# Patient Record
Sex: Male | Born: 1970 | Race: White | Hispanic: No | Marital: Single | Smoking: Current every day smoker
Health system: Southern US, Community
[De-identification: ages and names within clinical notes are randomized; demographics above are authoritative.]

## PROBLEM LIST (undated history)

## (undated) DIAGNOSIS — N200 Calculus of kidney: Secondary | ICD-10-CM

## (undated) HISTORY — PX: TONSILLECTOMY: SUR1361

## (undated) HISTORY — PX: CYSTOSCOPY: SUR368

## (undated) HISTORY — PX: LITHOTRIPSY: SUR834

---

## 2012-06-27 ENCOUNTER — Emergency Department (HOSPITAL_BASED_OUTPATIENT_CLINIC_OR_DEPARTMENT_OTHER): Payer: 59

## 2012-06-27 ENCOUNTER — Emergency Department (HOSPITAL_BASED_OUTPATIENT_CLINIC_OR_DEPARTMENT_OTHER)
Admission: EM | Admit: 2012-06-27 | Discharge: 2012-06-27 | Disposition: A | Discharge status: Home Health | Payer: 59 | Attending: Emergency Medicine | Admitting: Emergency Medicine

## 2012-06-27 ENCOUNTER — Encounter (HOSPITAL_BASED_OUTPATIENT_CLINIC_OR_DEPARTMENT_OTHER): Payer: Self-pay | Admitting: *Deleted

## 2012-06-27 DIAGNOSIS — R509 Fever, unspecified: Secondary | ICD-10-CM | POA: Insufficient documentation

## 2012-06-27 DIAGNOSIS — J189 Pneumonia, unspecified organism: Secondary | ICD-10-CM | POA: Insufficient documentation

## 2012-06-27 MED ORDER — SODIUM CHLORIDE 0.9 % IV SOLN: 1000.0000 mL | INTRAVENOUS | Status: DC

## 2012-06-27 MED ORDER — SODIUM CHLORIDE 0.9 % IV SOLN
1000.0000 mL | Freq: Once | INTRAVENOUS | Status: AC
  Administered 2012-06-27: 1000 mL via INTRAVENOUS

## 2012-06-27 MED ORDER — ALBUTEROL SULFATE HFA 108 (90 BASE) MCG/ACT IN AERS
2.0000 | INHALATION_SPRAY | Freq: Once | RESPIRATORY_TRACT | Status: AC
  Administered 2012-06-27: 2 via RESPIRATORY_TRACT
  Filled 2012-06-27: qty 6.7

## 2012-06-27 MED ORDER — IBUPROFEN 400 MG PO TABS
600.0000 mg | ORAL_TABLET | Freq: Once | ORAL | Status: AC
  Administered 2012-06-27: 600 mg via ORAL
  Filled 2012-06-27: qty 1

## 2012-06-27 MED ORDER — MOXIFLOXACIN HCL 400 MG PO TABS: 400.0000 mg | ORAL_TABLET | Freq: Every day | ORAL | Status: AC

## 2012-06-27 MED ORDER — MOXIFLOXACIN HCL 400 MG PO TABS
400.0000 mg | ORAL_TABLET | Freq: Once | ORAL | Status: AC
  Administered 2012-06-27: 400 mg via ORAL
  Filled 2012-06-27: qty 1

## 2012-06-27 NOTE — ED Notes (Signed)
Pt amb to room 9 with quick steady gait in nad. Pt wearing mask, reports cough, congestion and fevers x 2 days.

## 2012-06-27 NOTE — ED Provider Notes (Signed)
History     CSN: [id number]  Arrival date & time 06/27/12  4098   First MD Initiated Contact with Patient 06/27/12 [phone]      Chief Complaint  Patient presents with  . Nasal Congestion  . Fever  . Cough    (Consider location/radiation/quality/duration/timing/severity/associated sxs/prior treatment) The history is provided by the patient.   the patient reports coughing congestion over the past 2 days with associated fever.  He stopped smoking cigarettes 8 months ago.  He has no prior history of asthma or COPD.  He has a shortness of breath.  He reports fevers chills and myalgias.  He denies nausea and vomiting.  He has no chest pain.  He denies diarrhea.  He has no other complaints.  He is otherwise without significant medical problems.  His symptoms are mild in severity.  Nothing worsens or improves his symptoms.  History reviewed. No pertinent past medical history.  History reviewed. No pertinent past surgical history.  History reviewed. No pertinent family history.  History  Substance Use Topics  . Smoking status: Former Games developer  . Smokeless tobacco: Not on file  . Alcohol Use: No      Review of Systems  Constitutional: Positive for fever.  Respiratory: Positive for cough.   All other systems reviewed and are negative.    Allergies  Review of patient's allergies indicates no known allergies.  Home Medications  No current outpatient prescriptions on file.  BP 151/85  Pulse 112  Temp 101.2 F (38.4 C) (Oral)  Resp 18  Ht 5\' 9"  (1.753 m)  Wt 201 lb (91.173 kg)  BMI 29.68 kg/m2  SpO2 96%  Physical Exam  Nursing note and vitals reviewed. Constitutional: He is oriented to person, place, and time. He appears well-developed and well-nourished.  HENT:  Head: Normocephalic and atraumatic.  Eyes: EOM are normal.  Neck: Normal range of motion.  Cardiovascular: Normal rate, regular rhythm, normal heart sounds and intact distal pulses.   Pulmonary/Chest: Effort  normal and breath sounds normal. No respiratory distress.       Rhonchi in right lower base  Abdominal: Soft. He exhibits no distension. There is no tenderness.  Musculoskeletal: Normal range of motion.  Neurological: He is alert and oriented to person, place, and time.  Skin: Skin is warm and dry.  Psychiatric: He has a normal mood and affect. Judgment normal.    ED Course  Procedures (including critical care time)  Labs Reviewed - No data to display Dg Chest 2 View  06/27/2012  *RADIOLOGY REPORT*  Clinical Data: Cough, fever, congestion  CHEST - 2 VIEW  Comparison: None.  Findings: There is parenchymal opacity in the right lower lobe suspicious for pneumonia.  The remainder of the lungs are clear. No effusion is seen.  Mediastinal contours appear normal.  The heart is within normal limits in size.  No bony abnormality is seen.  IMPRESSION: Right lower lobe parenchymal opacity suspicious for pneumonia.   Original Report Authenticated By: Juline Patch, M.D.     I personally reviewed the imaging tests through PACS system     1. CAP (community acquired pneumonia)   2. Fever       MDM  Will treat his right lower lobe pneumonia with Avelox.  The patient was hydrated in the emergency department.  He feels much better at this time.  Albuterol inhaler to help with cough.  The patient is instructed to followup with his PCP for any new or worsening symptoms or  to return the emergency department.        Lyanne Co, MD 06/27/12 [phone]

## 2012-11-02 ENCOUNTER — Encounter (HOSPITAL_BASED_OUTPATIENT_CLINIC_OR_DEPARTMENT_OTHER): Payer: Self-pay

## 2012-11-02 ENCOUNTER — Emergency Department (HOSPITAL_BASED_OUTPATIENT_CLINIC_OR_DEPARTMENT_OTHER)
Admission: EM | Admit: 2012-11-02 | Discharge: 2012-11-02 | Disposition: A | Discharge status: Home / Self Care | Payer: Self-pay | Attending: Emergency Medicine | Admitting: Emergency Medicine

## 2012-11-02 ENCOUNTER — Emergency Department (HOSPITAL_BASED_OUTPATIENT_CLINIC_OR_DEPARTMENT_OTHER): Payer: Self-pay

## 2012-11-02 DIAGNOSIS — Z79899 Other long term (current) drug therapy: Secondary | ICD-10-CM | POA: Insufficient documentation

## 2012-11-02 DIAGNOSIS — R059 Cough, unspecified: Secondary | ICD-10-CM | POA: Insufficient documentation

## 2012-11-02 DIAGNOSIS — IMO0001 Reserved for inherently not codable concepts without codable children: Secondary | ICD-10-CM | POA: Insufficient documentation

## 2012-11-02 DIAGNOSIS — R5381 Other malaise: Secondary | ICD-10-CM | POA: Insufficient documentation

## 2012-11-02 DIAGNOSIS — J189 Pneumonia, unspecified organism: Secondary | ICD-10-CM | POA: Insufficient documentation

## 2012-11-02 DIAGNOSIS — R05 Cough: Secondary | ICD-10-CM | POA: Insufficient documentation

## 2012-11-02 DIAGNOSIS — F17200 Nicotine dependence, unspecified, uncomplicated: Secondary | ICD-10-CM | POA: Insufficient documentation

## 2012-11-02 DIAGNOSIS — J3489 Other specified disorders of nose and nasal sinuses: Secondary | ICD-10-CM | POA: Insufficient documentation

## 2012-11-02 DIAGNOSIS — M549 Dorsalgia, unspecified: Secondary | ICD-10-CM | POA: Insufficient documentation

## 2012-11-02 DIAGNOSIS — Z87442 Personal history of urinary calculi: Secondary | ICD-10-CM | POA: Insufficient documentation

## 2012-11-02 HISTORY — DX: Calculus of kidney: N20.0

## 2012-11-02 MED ORDER — METHOCARBAMOL 500 MG PO TABS
1000.0000 mg | ORAL_TABLET | Freq: Once | ORAL | Status: AC
Start: 2012-11-02 — End: 2012-11-02
  Administered 2012-11-02: 1000 mg via ORAL
  Filled 2012-11-02: qty 2

## 2012-11-02 MED ORDER — KETOROLAC TROMETHAMINE 60 MG/2ML IM SOLN
60.0000 mg | Freq: Once | INTRAMUSCULAR | Status: AC
  Administered 2012-11-02: 60 mg via INTRAMUSCULAR
  Filled 2012-11-02: qty 2

## 2012-11-02 MED ORDER — BENZONATATE 100 MG PO CAPS
100.0000 mg | ORAL_CAPSULE | Freq: Once | ORAL | Status: AC
  Administered 2012-11-02: 100 mg via ORAL
  Filled 2012-11-02: qty 1

## 2012-11-02 MED ORDER — IBUPROFEN 600 MG PO TABS: 600.0000 mg | ORAL_TABLET | Freq: Four times a day (QID) | ORAL | Status: AC | PRN

## 2012-11-02 MED ORDER — METHOCARBAMOL 500 MG PO TABS: 500.0000 mg | ORAL_TABLET | Freq: Two times a day (BID) | ORAL | Status: AC

## 2012-11-02 MED ORDER — BENZONATATE 100 MG PO CAPS: 100.0000 mg | ORAL_CAPSULE | Freq: Three times a day (TID) | ORAL | Status: AC

## 2012-11-02 MED ORDER — AZITHROMYCIN 250 MG PO TABS: 250.0000 mg | ORAL_TABLET | Freq: Every day | ORAL | Status: AC

## 2012-11-02 MED ORDER — DEXTROSE 5 % IV SOLN
1.0000 g | Freq: Once | INTRAVENOUS | Status: AC
  Administered 2012-11-02: 1 g via INTRAVENOUS
  Filled 2012-11-02: qty 10

## 2012-11-02 NOTE — ED Provider Notes (Signed)
History     CSN: [id number]  Arrival date & time 11/02/12  4098   First MD Initiated Contact with Patient 11/02/12 [phone]      Chief Complaint  Patient presents with  . Fever  . Generalized Body Aches  . Cough    (Consider location/radiation/quality/duration/timing/severity/associated sxs/prior treatment) HPI Pt with 2 days of fever, chills, myalgia, non-productive cough. No sick contacts. No headache or neck pain. Pt states he pulled some muscles in his back last week while moving a refrigerator and it is exacerbated with coughing. No focal weakness or numbness.  Past Medical History  Diagnosis Date  . Kidney stones     Past Surgical History  Procedure Date  . Lithotripsy   . Cystoscopy   . Tonsillectomy     No family history on file.  History  Substance Use Topics  . Smoking status: Current Every Day Smoker -- 0.5 packs/day    Types: Cigarettes  . Smokeless tobacco: Not on file  . Alcohol Use: No      Review of Systems  Constitutional: Positive for fever, chills and fatigue.  HENT: Positive for congestion. Negative for sore throat, neck pain and neck stiffness.   Respiratory: Positive for cough. Negative for shortness of breath.   Cardiovascular: Negative for chest pain.  Gastrointestinal: Negative for nausea, vomiting and abdominal pain.  Musculoskeletal: Positive for myalgias and back pain.  Skin: Negative for pallor and rash.  Neurological: Negative for dizziness, weakness, light-headedness, numbness and headaches.  All other systems reviewed and are negative.    Allergies  Review of patient's allergies indicates no known allergies.  Home Medications   Current Outpatient Rx  Name  Route  Sig  Dispense  Refill  . AZITHROMYCIN 250 MG PO TABS   Oral   Take 1 tablet (250 mg total) by mouth daily. Take first 2 tablets together, then 1 every day until finished.   6 tablet   0   . BENZONATATE 100 MG PO CAPS   Oral   Take 1 capsule (100 mg total) by  mouth every 8 (eight) hours.   21 capsule   0   . IBUPROFEN 600 MG PO TABS   Oral   Take 1 tablet (600 mg total) by mouth every 6 (six) hours as needed for pain.   30 tablet   0   . METHOCARBAMOL 500 MG PO TABS   Oral   Take 1 tablet (500 mg total) by mouth 2 (two) times daily.   20 tablet   0     BP 149/89  Pulse 106  Temp 100 F (37.8 C)  Resp 16  Ht 5\' 9"  (1.753 m)  Wt 205 lb (92.987 kg)  BMI 30.27 kg/m2  SpO2 97%  Physical Exam  Nursing note and vitals reviewed. Constitutional: He is oriented to person, place, and time. He appears well-developed and well-nourished. No distress.  HENT:  Head: Normocephalic and atraumatic.  Mouth/Throat: Oropharynx is clear and moist.  Eyes: EOM are normal. Pupils are equal, round, and reactive to light.  Neck: Normal range of motion. Neck supple.  Cardiovascular: Normal rate and regular rhythm.   Pulmonary/Chest: Effort normal and breath sounds normal. No respiratory distress. He has no wheezes. He has no rales. He exhibits no tenderness.  Abdominal: Soft. Bowel sounds are normal. He exhibits no mass. There is no tenderness. There is no rebound and no guarding.  Musculoskeletal: Normal range of motion. He exhibits tenderness (TTP over R paraspinal lumbar muscles.  No midline TTP). He exhibits no edema.  Neurological: He is alert and oriented to person, place, and time.  Skin: Skin is warm and dry. No rash noted. No erythema.  Psychiatric: He has a normal mood and affect. His behavior is normal.    ED Course  Procedures (including critical care time)  Labs Reviewed - No data to display Dg Chest 2 View  11/02/2012  *RADIOLOGY REPORT*  Clinical Data: Fever and generalized bodyaches.  Cough  CHEST - 2 VIEW  Comparison: 06/27/2012  Findings: Normal heart size.  No pleural effusion or edema.  Plate- like atelectasis is noted within the left lower lobe.  Within the right upper lobe there are patchy airspace densities, suspicious for  pneumonia.  Also in the right upper lobe is a linear branching opacity which is favored to represent mucous plugging.  IMPRESSION:  1.  Right upper lobe opacity is concerning for pneumonia. 2.  Suspect mucus plugging within the right upper lobe airway.   Original Report Authenticated By: Signa Kell, M.D.      1. Community acquired pneumonia       MDM  Pt is well appearing with flu-like symptoms. Will r/o PNA with chest xray  Rocephin given IV and will d/c with z pak. Advised to return for worsening symptoms or concerns       Loren Racer, MD 11/02/12 1122

## 2012-11-02 NOTE — ED Notes (Signed)
Pt reports onset of generalized body aches, fever and cough that started yesterday.

## 2022-01-30 ENCOUNTER — Emergency Department (HOSPITAL_BASED_OUTPATIENT_CLINIC_OR_DEPARTMENT_OTHER)
Admission: EM | Admit: 2022-01-30 | Discharge: 2022-01-30 | Disposition: A | Discharge status: Home / Self Care | Payer: Self-pay | Attending: Emergency Medicine | Admitting: Emergency Medicine

## 2022-01-30 ENCOUNTER — Encounter (HOSPITAL_BASED_OUTPATIENT_CLINIC_OR_DEPARTMENT_OTHER): Payer: Self-pay | Admitting: Emergency Medicine

## 2022-01-30 ENCOUNTER — Emergency Department (HOSPITAL_BASED_OUTPATIENT_CLINIC_OR_DEPARTMENT_OTHER): Payer: Self-pay

## 2022-01-30 ENCOUNTER — Other Ambulatory Visit: Payer: Self-pay

## 2022-01-30 DIAGNOSIS — N410 Acute prostatitis: Secondary | ICD-10-CM | POA: Insufficient documentation

## 2022-01-30 DIAGNOSIS — Z20822 Contact with and (suspected) exposure to covid-19: Secondary | ICD-10-CM | POA: Insufficient documentation

## 2022-01-30 DIAGNOSIS — R000 Tachycardia, unspecified: Secondary | ICD-10-CM | POA: Insufficient documentation

## 2022-01-30 DIAGNOSIS — R197 Diarrhea, unspecified: Secondary | ICD-10-CM | POA: Insufficient documentation

## 2022-01-30 DIAGNOSIS — R112 Nausea with vomiting, unspecified: Secondary | ICD-10-CM | POA: Insufficient documentation

## 2022-01-30 DIAGNOSIS — R0682 Tachypnea, not elsewhere classified: Secondary | ICD-10-CM | POA: Insufficient documentation

## 2022-01-30 LAB — RESP PANEL BY RT-PCR (FLU A&B, COVID) ARPGX2
Influenza A by PCR: NEGATIVE
Influenza B by PCR: NEGATIVE
SARS Coronavirus 2 by RT PCR: NEGATIVE

## 2022-01-30 LAB — LACTIC ACID, PLASMA: Lactic Acid, Venous: 1.5 mmol/L (ref 0.5–1.9)

## 2022-01-30 LAB — URINALYSIS, ROUTINE W REFLEX MICROSCOPIC
Glucose, UA: 100 mg/dL — AB
Ketones, ur: 15 mg/dL — AB
Leukocytes,Ua: NEGATIVE
Nitrite: NEGATIVE
Protein, ur: >=300 mg/dL — AB
Specific Gravity, Urine: >=1.03 (ref 1.005–1.030)
pH: 5 (ref 5.0–8.0)

## 2022-01-30 LAB — DIFFERENTIAL
Abs Immature Granulocytes: 0.06 10*3/uL (ref 0.00–0.07)
Basophils Absolute: 0 10*3/uL (ref 0.0–0.1)
Basophils Relative: 0 %
Eosinophils Absolute: 0 10*3/uL (ref 0.0–0.5)
Eosinophils Relative: 0 %
Immature Granulocytes: 1 %
Lymphocytes Relative: 6 %
Lymphs Abs: 0.6 10*3/uL — ABNORMAL LOW (ref 0.7–4.0)
Monocytes Absolute: 0.3 10*3/uL (ref 0.1–1.0)
Monocytes Relative: 3 %
Neutro Abs: 9.5 10*3/uL — ABNORMAL HIGH (ref 1.7–7.7)
Neutrophils Relative %: 90 %
Smear Review: NORMAL

## 2022-01-30 LAB — COMPREHENSIVE METABOLIC PANEL
ALT: 21 U/L (ref 0–44)
AST: 18 U/L (ref 15–41)
Albumin: 3.5 g/dL (ref 3.5–5.0)
Alkaline Phosphatase: 103 U/L (ref 38–126)
Anion gap: 11 (ref 5–15)
BUN: 17 mg/dL (ref 6–20)
CO2: 18 mmol/L — ABNORMAL LOW (ref 22–32)
Calcium: 8.6 mg/dL — ABNORMAL LOW (ref 8.9–10.3)
Chloride: 102 mmol/L (ref 98–111)
Creatinine, Ser: 1.31 mg/dL — ABNORMAL HIGH (ref 0.61–1.24)
GFR, Estimated: >60 mL/min (ref >60)
Glucose, Bld: 129 mg/dL — ABNORMAL HIGH (ref 70–99)
Potassium: 3.3 mmol/L — ABNORMAL LOW (ref 3.5–5.1)
Sodium: 131 mmol/L — ABNORMAL LOW (ref 135–145)
Total Bilirubin: 1.2 mg/dL (ref 0.3–1.2)
Total Protein: 7.6 g/dL (ref 6.5–8.1)

## 2022-01-30 LAB — URINALYSIS, MICROSCOPIC (REFLEX)

## 2022-01-30 LAB — PROTIME-INR
INR: 1.2 (ref 0.8–1.2)
Prothrombin Time: 14.7 seconds (ref 11.4–15.2)

## 2022-01-30 LAB — CBC
HCT: 45 % (ref 39.0–52.0)
Hemoglobin: 16 g/dL (ref 13.0–17.0)
MCH: 30.8 pg (ref 26.0–34.0)
MCHC: 35.6 g/dL (ref 30.0–36.0)
MCV: 86.7 fL (ref 80.0–100.0)
Platelets: 150 10*3/uL (ref 150–400)
RBC: 5.19 MIL/uL (ref 4.22–5.81)
RDW: 12 % (ref 11.5–15.5)
WBC: 10.5 10*3/uL (ref 4.0–10.5)
nRBC: 0 % (ref 0.0–0.2)

## 2022-01-30 LAB — LIPASE, BLOOD: Lipase: 30 U/L (ref 11–51)

## 2022-01-30 LAB — APTT: aPTT: 33 seconds (ref 24–36)

## 2022-01-30 MED ORDER — IOHEXOL 300 MG/ML  SOLN
100.0000 mL | Freq: Once | INTRAMUSCULAR | Status: AC | PRN
  Administered 2022-01-30: 100 mL via INTRAVENOUS

## 2022-01-30 MED ORDER — ONDANSETRON 4 MG PO TBDP: ORAL_TABLET | ORAL | 0 refills | Status: AC

## 2022-01-30 MED ORDER — SULFAMETHOXAZOLE-TRIMETHOPRIM 800-160 MG PO TABS: 1.0000 | ORAL_TABLET | Freq: Two times a day (BID) | ORAL | 0 refills | Status: AC

## 2022-01-30 MED ORDER — ACETAMINOPHEN 500 MG PO TABS
1000.0000 mg | ORAL_TABLET | Freq: Once | ORAL | Status: AC
Start: 2022-01-30 — End: 2022-01-30
  Administered 2022-01-30: 1000 mg via ORAL
  Filled 2022-01-30: qty 2

## 2022-01-30 MED ORDER — ONDANSETRON HCL 4 MG/2ML IJ SOLN
4.0000 mg | Freq: Once | INTRAMUSCULAR | Status: AC
  Administered 2022-01-30: 4 mg via INTRAVENOUS
  Filled 2022-01-30: qty 2

## 2022-01-30 MED ORDER — SODIUM CHLORIDE 0.9 % IV SOLN
2.0000 g | Freq: Once | INTRAVENOUS | Status: AC
Start: 2022-01-30 — End: 2022-01-30
  Administered 2022-01-30: 2 g via INTRAVENOUS
  Filled 2022-01-30: qty 20

## 2022-01-30 MED ORDER — LACTATED RINGERS IV BOLUS
1000.0000 mL | Freq: Once | INTRAVENOUS | Status: AC
  Administered 2022-01-30: 1000 mL via INTRAVENOUS

## 2022-01-30 MED ORDER — LACTATED RINGERS IV BOLUS (SEPSIS)
1000.0000 mL | Freq: Once | INTRAVENOUS | Status: AC
  Administered 2022-01-30: 1000 mL via INTRAVENOUS

## 2022-01-30 NOTE — Discharge Instructions (Signed)
Please call the urology office and try to see them within the next week.  Please return for worsening abdominal pain or inability to eat or drink.  You can take Imodium for diarrhea. ? ?Take tylenol 2 pills 4 times a day and motrin 4 pills 3 times a day.  Drink plenty of fluids.   ?

## 2022-01-30 NOTE — ED Triage Notes (Signed)
Pt reports urinary frequency, fever, and n/v/d since Wednesday. Had a fever of 101.60F this am at home. Has not taken antipyretics yet.  ?

## 2022-01-30 NOTE — ED Provider Notes (Signed)
?[facility] EMERGENCY DEPARTMENT ?Provider Note ? ? ?CSN: [id number] ?Arrival date & time: 01/30/22  0815 ? ?  ? ?History ? ?Chief Complaint  ?Patient presents with  ? Emesis  ? ? ?Michael Mcpherson is a 51 y.o. male. ? ?51 yo M with a chief complaints of increased urinary frequency and fever.  This been going on for a few days now.  He is also been having nausea vomiting and diarrhea.  Not really able to keep anything down.  Denies any significant abdominal pain.  Denies cough or congestion.  Denies sick contacts. ? ? ?Emesis ? ?  ? ?Home Medications ?Prior to Admission medications   ?Medication Sig Start Date End Date Taking? Authorizing Provider  ?ondansetron (ZOFRAN-ODT) 4 MG disintegrating tablet 4mg  ODT q4 hours prn nausea/vomit 01/30/22  Yes 02/01/22, DO  ?sulfamethoxazole-trimethoprim (BACTRIM DS) 800-160 MG tablet Take 1 tablet by mouth 2 (two) times daily for 14 days. 01/30/22 02/13/22 Yes 02/15/22, DO  ?azithromycin (ZITHROMAX) 250 MG tablet Take 1 tablet (250 mg total) by mouth daily. Take first 2 tablets together, then 1 every day until finished. 11/02/12   11/04/12, MD  ?benzonatate (TESSALON) 100 MG capsule Take 1 capsule (100 mg total) by mouth every 8 (eight) hours. 11/02/12   11/04/12, MD  ?ibuprofen (ADVIL,MOTRIN) 600 MG tablet Take 1 tablet (600 mg total) by mouth every 6 (six) hours as needed for pain. 11/02/12   11/04/12, MD  ?methocarbamol (ROBAXIN) 500 MG tablet Take 1 tablet (500 mg total) by mouth 2 (two) times daily. 11/02/12   11/04/12, MD  ?   ? ?Allergies    ?Patient has no known allergies.   ? ?Review of Systems   ?Review of Systems  ?Gastrointestinal:  Positive for vomiting.  ? ?Physical Exam ?Updated Vital Signs ?BP 127/74   Pulse (!) 113   Temp (!) 102.7 ?F (39.3 ?C) (Oral)   Resp (!) 21   SpO2 98%  ?Physical Exam ?Vitals and nursing note reviewed.  ?Constitutional:   ?   Appearance: He is well-developed.  ?HENT:  ?   Head: Normocephalic and  atraumatic.  ?Eyes:  ?   Pupils: Pupils are equal, round, and reactive to light.  ?Neck:  ?   Vascular: No JVD.  ?Cardiovascular:  ?   Rate and Rhythm: Regular rhythm. Tachycardia present.  ?   Heart sounds: No murmur heard. ?  No friction rub. No gallop.  ?Pulmonary:  ?   Effort: No respiratory distress.  ?   Breath sounds: No wheezing.  ?   Comments: Tachypnea ?Abdominal:  ?   General: There is no distension.  ?   Tenderness: There is no abdominal tenderness. There is no guarding or rebound.  ?Musculoskeletal:     ?   General: Normal range of motion.  ?   Cervical back: Normal range of motion and neck supple.  ?Skin: ?   Coloration: Skin is not pale.  ?   Findings: No rash.  ?Neurological:  ?   Mental Status: He is alert and oriented to person, place, and time.  ?Psychiatric:     ?   Behavior: Behavior normal.  ? ? ?ED Results / Procedures / Treatments   ?Labs ?(all labs ordered are listed, but only abnormal results are displayed) ?Labs Reviewed  ?COMPREHENSIVE METABOLIC PANEL - Abnormal; Notable for the following components:  ?    Result Value  ? Sodium 131 (*)   ? Potassium 3.3 (*)   ?  CO2 18 (*)   ? Glucose, Bld 129 (*)   ? Creatinine, Ser 1.31 (*)   ? Calcium 8.6 (*)   ? All other components within normal limits  ?URINALYSIS, ROUTINE W REFLEX MICROSCOPIC - Abnormal; Notable for the following components:  ? Color, Urine ORANGE (*)   ? Glucose, UA 100 (*)   ? Hgb urine dipstick MODERATE (*)   ? Bilirubin Urine MODERATE (*)   ? Ketones, ur 15 (*)   ? Protein, ur >=300 (*)   ? All other components within normal limits  ?URINALYSIS, MICROSCOPIC (REFLEX) - Abnormal; Notable for the following components:  ? Bacteria, UA MANY (*)   ? All other components within normal limits  ?DIFFERENTIAL - Abnormal; Notable for the following components:  ? Neutro Abs 9.5 (*)   ? Lymphs Abs 0.6 (*)   ? All other components within normal limits  ?RESP PANEL BY RT-PCR (FLU A&B, COVID) [id number]  ?URINE CULTURE  ?CULTURE, BLOOD (ROUTINE X  2)  ?CULTURE, BLOOD (ROUTINE X 2)  ?LIPASE, BLOOD  ?CBC  ?LACTIC ACID, PLASMA  ?PROTIME-INR  ?APTT  ? ? ?EKG ?EKG Interpretation ? ?Date/Time:  Sunday January 30 2022 08:32:43 EDT ?Ventricular Rate:  132 ?PR Interval:  113 ?QRS Duration: 125 ?QT Interval:  344 ?QTC Calculation: 510 ?R Axis:   98 ?Text Interpretation: Sinus tachycardia Atrial premature complex Nonspecific intraventricular conduction delay Baseline wander in lead(s) II V5 V6 No old tracing to compare Confirmed by Melene PlanFloyd, Timofey Carandang [phone]([id number]) on 01/30/2022 9:54:59 AM ? ?Radiology ?CT ABDOMEN PELVIS W CONTRAST ? ?Result Date: 01/30/2022 ?CLINICAL DATA:  Abdominal pain and fever for 5 days. Nausea, vomiting, and diarrhea. Urinary frequency. EXAM: CT ABDOMEN AND PELVIS WITH CONTRAST TECHNIQUE: Multidetector CT imaging of the abdomen and pelvis was performed using the standard protocol following bolus administration of intravenous contrast. RADIATION DOSE REDUCTION: This exam was performed according to the departmental dose-optimization program which includes automated exposure control, adjustment of the mA and/or kV according to patient size and/or use of iterative reconstruction technique. CONTRAST:  100mL OMNIPAQUE IOHEXOL 300 MG/ML  SOLN COMPARISON:  None. FINDINGS: Lower Chest: No acute findings. Hepatobiliary: No hepatic masses identified. Gallbladder is unremarkable. No evidence of biliary ductal dilatation. Pancreas:  No mass or inflammatory changes. Spleen: Within normal limits in size and appearance. Adrenals/Urinary Tract: No masses identified. A 4 mm calculus is seen in the upper pole the left kidney. No evidence of ureteral calculi or hydronephrosis. Mild diffuse bladder wall thickening is seen, with hazy opacity in the adjacent perivesical fat, consistent with cystitis. Stomach/Bowel: No evidence of obstruction, inflammatory process or abnormal fluid collections. Vascular/Lymphatic: No pathologically enlarged lymph nodes. No acute vascular findings. Aortic  atherosclerotic calcification noted. Congenital left-sided IVC incidentally noted. Reproductive:  Mildly enlarged prostate. Other:  None. Musculoskeletal:  No suspicious bone lesions identified. IMPRESSION: Mild diffuse bladder wall thickening and hazy opacity in perivesical fat, consistent with cystitis. Recommend correlation with urinalysis. Mildly enlarged prostate. 4 mm nonobstructing left renal calculus. Aortic Atherosclerosis (ICD10-I70.0). Electronically Signed   By: Danae OrleansJohn A Stahl M.D.   On: 01/30/2022 10:37  ? ?DG Chest Port 1 View ? ?Result Date: 01/30/2022 ?CLINICAL DATA:  Fever.  Nausea and vomiting.  Diarrhea. EXAM: PORTABLE CHEST 1 VIEW COMPARISON:  11/02/2012 FINDINGS: The heart size and mediastinal contours are within normal limits. Both lungs are clear. The visualized skeletal structures are unremarkable. IMPRESSION: No active disease. Electronically Signed   By: Danae OrleansJohn A Stahl M.D.   On: 01/30/2022 08:52   ? ?  Procedures ?Procedures  ? ? ?Medications Ordered in ED ?Medications  ?cefTRIAXone (ROCEPHIN) 2 g in sodium chloride 0.9 % 100 mL IVPB (2 g Intravenous New Bag/Given 01/30/22 1038)  ?lactated ringers bolus 1,000 mL (0 mLs Intravenous Stopped 01/30/22 0958)  ?acetaminophen (TYLENOL) tablet 1,000 mg (1,000 mg Oral Given 01/30/22 0914)  ?ondansetron [facility]) injection 4 mg (4 mg Intravenous Given 01/30/22 0915)  ?lactated ringers bolus 1,000 mL (1,000 mLs Intravenous New Bag/Given 01/30/22 0958)  ?iohexol (OMNIPAQUE) 300 MG/ML solution 100 mL (100 mLs Intravenous Contrast Given 01/30/22 1009)  ? ? ?ED Course/ Medical Decision Making/ A&P ?  ?                        ?Medical Decision Making ?Amount and/or Complexity of Data Reviewed ?Labs: ordered. ?Radiology: ordered. ?ECG/medicine tests: ordered. ? ?Risk ?OTC drugs. ?Prescription drug management. ? ? ?51 yo M with a chief complaints of persistent fevers.  Going on for about 3 to 4 days now.  Also with nausea vomiting and diarrhea.  Unable to tolerate  anything by mouth.  Found to be profoundly tachycardic with a heart rate in the 150s on minimal exertion on initial exam.  Given a liter of IV fluids and Tylenol with some improvement.  Chest x-ray independentl

## 2022-01-31 ENCOUNTER — Telehealth (HOSPITAL_BASED_OUTPATIENT_CLINIC_OR_DEPARTMENT_OTHER): Payer: Self-pay | Admitting: Emergency Medicine

## 2022-01-31 LAB — BLOOD CULTURE ID PANEL (REFLEXED) - BCID2

## 2022-02-01 LAB — URINE CULTURE: Culture: 60000 — AB

## 2022-02-02 ENCOUNTER — Telehealth: Payer: Self-pay

## 2022-02-02 LAB — CULTURE, BLOOD (ROUTINE X 2): Special Requests: ADEQUATE

## 2022-02-02 NOTE — Telephone Encounter (Signed)
Post ED Visit - Positive Culture Follow-up ? ?Culture report reviewed by antimicrobial stewardship pharmacist: ?[facility] ?[]  , Enzo Bi.D. ?[]  [address], .D., BCPS AQ-ID ?[]  Celedonio Miyamoto, Pharm.D., BCPS ?[]  [address], Pharm.D., BCPS ?[]  [place], Garvin Fila.D., BCPS, AAHIVP ?[]  , Pharm.D., BCPS, AAHIVP ?[x]  Georgina Pillion, PharmD, BCPS ?[]  , PharmD, BCPS ?[]  [place], PharmD, BCPS ?[]  [address], PharmD ?[]  , PharmD, BCPS ?[]  Estella Husk, PharmD ? ? [facility] ?[]  Lysle Pearl, PharmD ?[]  , PharmD ?[]  Phillips Climes, PharmD ?[]  , Rph ?[]  Agapito Games) , PharmD ?[]  Verlan Friends, PharmD ?[]  , PharmD ?[]  Mervyn Gay, PharmD ?[]  , PharmD ?[]  Vinnie Level, PharmD ?[]  Gerri Spore, PharmD ?[]  , PharmD ?[]  Len Childs, PharmD ? ? ?Positive urine culture ?Treated with Sulfamethoxazole-trimethoprim, organism sensitive to the same and no further patient follow-up is required at this time. ? ? ?02/02/2022, 9:53 AM ?  ?

## 2022-02-04 LAB — CULTURE, BLOOD (ROUTINE X 2)
Culture: NO GROWTH
Special Requests: ADEQUATE

## 2023-12-04 IMAGING — DX DG CHEST 1V PORT
1 series · 1 of 1 positions shown · non-contrast
Comparison: 11/02/2012

CLINICAL DATA: Fever.  Nausea and vomiting.  Diarrhea.

EXAM:
PORTABLE CHEST 1 VIEW

[chest ap]
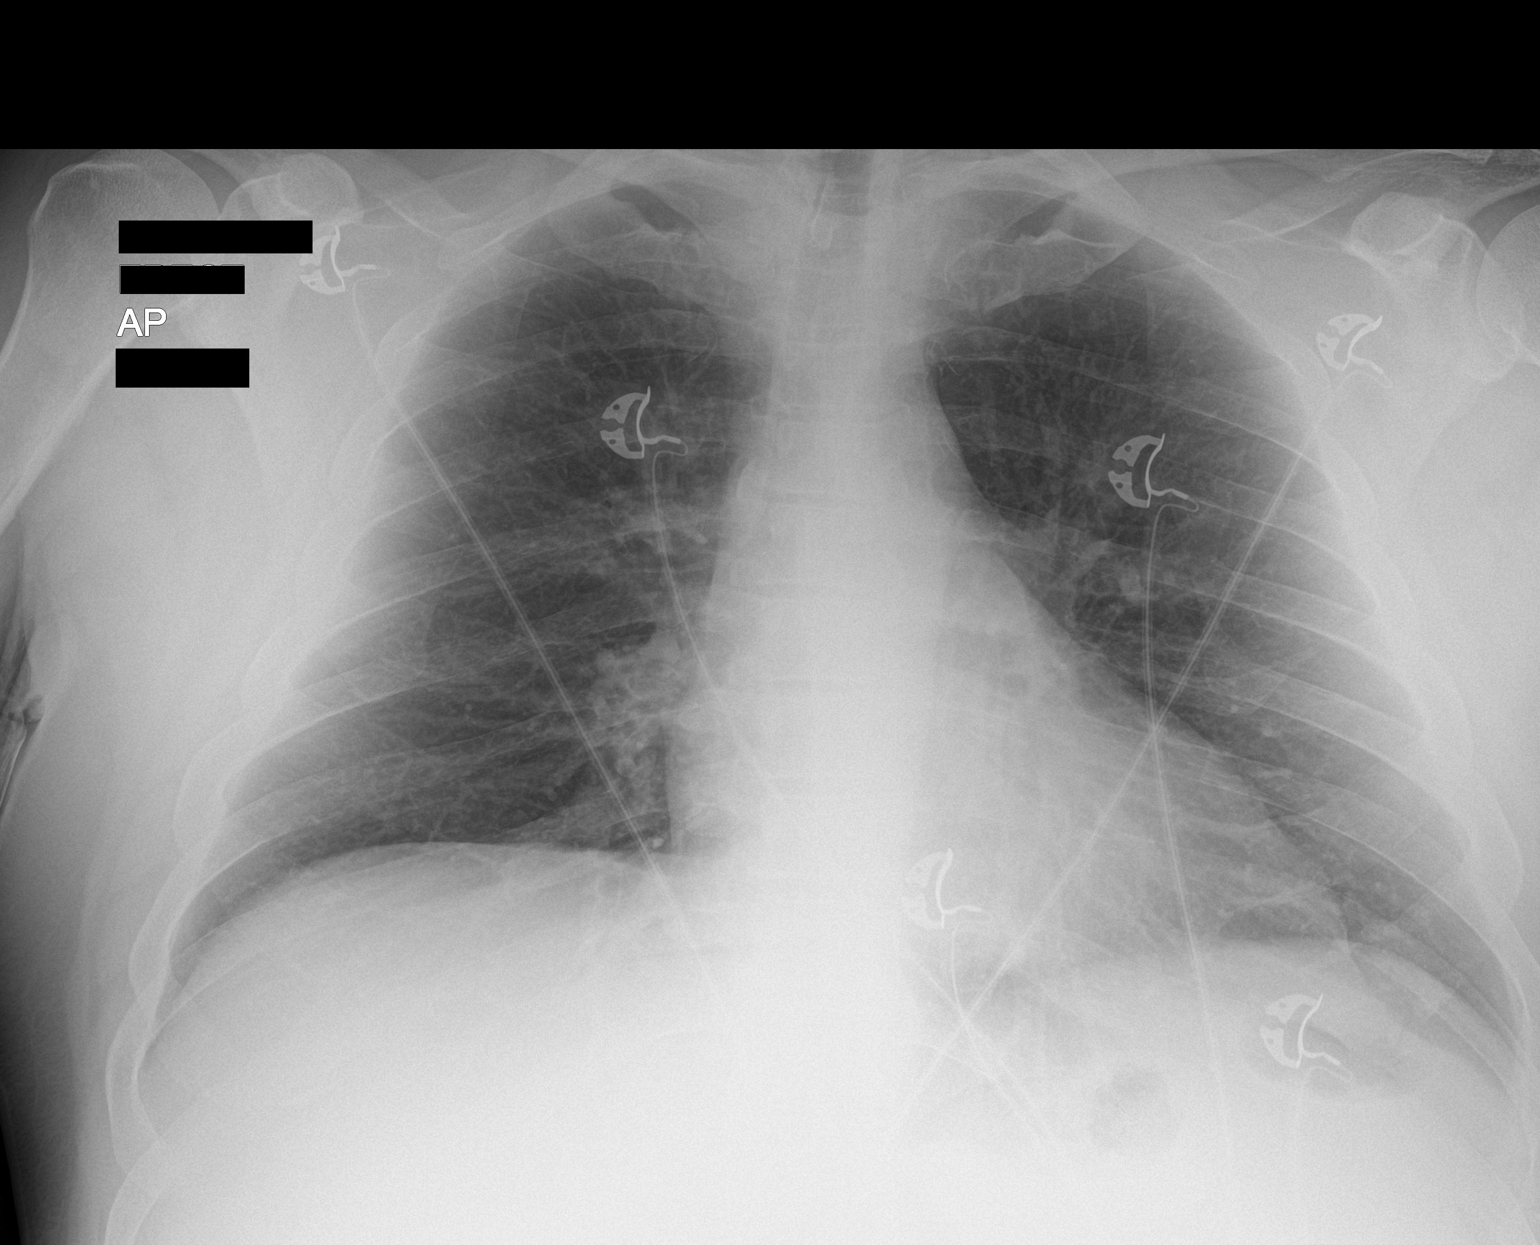

[1 of 1 positions shown; findings below may reference images not displayed]

FINDINGS: The heart size and mediastinal contours are within normal limits.
Both lungs are clear. The visualized skeletal structures are
unremarkable.
IMPRESSION: No active disease.
